# Patient Record
Sex: Male | Born: 1975 | Race: Black or African American | Hispanic: No | Marital: Single | Smoking: Current every day smoker
Health system: Southern US, Community
[De-identification: ages and names within clinical notes are randomized; demographics above are authoritative.]

---

## 2014-09-02 ENCOUNTER — Encounter (HOSPITAL_BASED_OUTPATIENT_CLINIC_OR_DEPARTMENT_OTHER): Payer: Self-pay | Admitting: *Deleted

## 2014-09-02 ENCOUNTER — Emergency Department (HOSPITAL_BASED_OUTPATIENT_CLINIC_OR_DEPARTMENT_OTHER): Payer: Self-pay

## 2014-09-02 ENCOUNTER — Emergency Department (HOSPITAL_BASED_OUTPATIENT_CLINIC_OR_DEPARTMENT_OTHER)
Admission: EM | Admit: 2014-09-02 | Discharge: 2014-09-02 | Disposition: A | Discharge status: Home / Self Care | Payer: Self-pay | Attending: Emergency Medicine | Admitting: Emergency Medicine

## 2014-09-02 DIAGNOSIS — Y998 Other external cause status: Secondary | ICD-10-CM | POA: Insufficient documentation

## 2014-09-02 DIAGNOSIS — Z720 Tobacco use: Secondary | ICD-10-CM | POA: Insufficient documentation

## 2014-09-02 DIAGNOSIS — Y9389 Activity, other specified: Secondary | ICD-10-CM | POA: Insufficient documentation

## 2014-09-02 DIAGNOSIS — Y9289 Other specified places as the place of occurrence of the external cause: Secondary | ICD-10-CM | POA: Insufficient documentation

## 2014-09-02 DIAGNOSIS — S93402A Sprain of unspecified ligament of left ankle, initial encounter: Secondary | ICD-10-CM | POA: Insufficient documentation

## 2014-09-02 DIAGNOSIS — X58XXXA Exposure to other specified factors, initial encounter: Secondary | ICD-10-CM | POA: Insufficient documentation

## 2014-09-02 NOTE — ED Notes (Signed)
Pt injured his left ankle 2 days ago while moving furniture. Same is swollen. Pt able to ambulate unassisted, with quick steady gait.

## 2014-09-02 NOTE — Discharge Instructions (Signed)
1. Medications: Ibuprofen as needed, usual home medications 2. Treatment: rest, drink plenty of fluids, use brace 3. Follow Up: Please followup with your primary doctor in 2-3 days for discussion of your diagnoses and further evaluation after today's visit; if you do not have a primary care doctor use the resource guide provided to find one; Please return to the ER for worsening symptoms    Ankle Sprain An ankle sprain is an injury to the strong, fibrous tissues (ligaments) that hold the bones of your ankle joint together.  CAUSES An ankle sprain is usually caused by a fall or by twisting your ankle. Ankle sprains most commonly occur when you step on the outer edge of your foot, and your ankle turns inward. People who participate in sports are more prone to these types of injuries.  SYMPTOMS   Pain in your ankle. The pain may be present at rest or only when you are trying to stand or walk.  Swelling.  Bruising. Bruising may develop immediately or within 1 to 2 days after your injury.  Difficulty standing or walking, particularly when turning corners or changing directions. DIAGNOSIS  Your caregiver will ask you details about your injury and perform a physical exam of your ankle to determine if you have an ankle sprain. During the physical exam, your caregiver will press on and apply pressure to specific areas of your foot and ankle. Your caregiver will try to move your ankle in certain ways. An X-ray exam may be done to be sure a bone was not broken or a ligament did not separate from one of the bones in your ankle (avulsion fracture).  TREATMENT  Certain types of braces can help stabilize your ankle. Your caregiver can make a recommendation for this. Your caregiver may recommend the use of medicine for pain. If your sprain is severe, your caregiver may refer you to a surgeon who helps to restore function to parts of your skeletal system (orthopedist) or a physical therapist. HOME CARE  INSTRUCTIONS   Apply ice to your injury for 1-2 days or as directed by your caregiver. Applying ice helps to reduce inflammation and pain.  Put ice in a plastic bag.  Place a towel between your skin and the bag.  Leave the ice on for 15-20 minutes at a time, every 2 hours while you are awake.  Only take over-the-counter or prescription medicines for pain, discomfort, or fever as directed by your caregiver.  Elevate your injured ankle above the level of your heart as much as possible for 2-3 days.  If your caregiver recommends crutches, use them as instructed. Gradually put weight on the affected ankle. Continue to use crutches or a cane until you can walk without feeling pain in your ankle.  If you have a plaster splint, wear the splint as directed by your caregiver. Do not rest it on anything harder than a pillow for the first 24 hours. Do not put weight on it. Do not get it wet. You may take it off to take a shower or bath.  You may have been given an elastic bandage to wear around your ankle to provide support. If the elastic bandage is too tight (you have numbness or tingling in your foot or your foot becomes cold and blue), adjust the bandage to make it comfortable.  If you have an air splint, you may blow more air into it or let air out to make it more comfortable. You may take your splint off  at night and before taking a shower or bath. Wiggle your toes in the splint several times per day to decrease swelling. SEEK MEDICAL CARE IF:   You have rapidly increasing bruising or swelling.  Your toes feel extremely cold or you lose feeling in your foot.  Your pain is not relieved with medicine. SEEK IMMEDIATE MEDICAL CARE IF:  Your toes are numb or blue.  You have severe pain that is increasing. MAKE SURE YOU:   Understand these instructions.  Will watch your condition.  Will get help right away if you are not doing well or get worse. Document Released: 12/22/2004 Document  Revised: 09/16/2011 Document Reviewed: 01/03/2011 [facility] Patient Information 2015 [place], [place]. This information is not intended to replace advice given to you by your health care provider. Make sure you discuss any questions you have with your health care provider.    Emergency Department Resource Guide 1) Find a Doctor and Pay Out of Pocket Although you won't have to find out who is covered by your insurance plan, it is a good idea to ask around and get recommendations. You will then need to call the office and see if the doctor you have chosen will accept you as a new patient and what types of options they offer for patients who are self-pay. Some doctors offer discounts or will set up payment plans for their patients who do not have insurance, but you will need to ask so you aren't surprised when you get to your appointment.  2) Contact Your Local Health Department Not all health departments have doctors that can see patients for sick visits, but many do, so it is worth a call to see if yours does. If you don't know where your local health department is, you can check in your phone book. The CDC also has a tool to help you locate your state's health department, and many state websites also have listings of all of their local health departments.  3) Find a Walk-in Clinic If your illness is not likely to be very severe or complicated, you may want to try a walk in clinic. These are popping up all over the country in pharmacies, drugstores, and shopping centers. They're usually staffed by nurse practitioners or physician assistants that have been trained to treat common illnesses and complaints. They're usually fairly quick and inexpensive. However, if you have serious medical issues or chronic medical problems, these are probably not your best option.  No Primary Care Doctor: - Call Health Connect at  [phone] - they can help you locate a primary care doctor that  accepts your insurance,  provides certain services, etc. - Physician Referral Service- [phone]  Chronic Pain Problems: Organization         Address  Phone   Notes  [facility]  [phone] Patients need to be referred by their primary care doctor.   Medication Assistance: Organization         Address  Phone   Notes  [facility] [address] [place] [place], [place] [address] [phone] --Must be a resident of [facility] -- Must have NO insurance coverage whatsoever (no Medicaid/ Medicare, etc.) -- The pt. MUST have a primary care doctor that directs their care regularly and follows them in the community   [facility]ist  [phone]   [employer]  [phone]    Agencies that provide inexpensive medical care: Organization  Address  Phone   Notes  [facility]  [phone]) [phone]   [facility]    [phone]   [facility] [address] [place], [place] [address] [phone]   [facility] [address], [place] [phone]) [phone]   [facility]    [phone]) [phone]   [facility]    [phone]   [facility]  [address], [place] Phone:  [phone](336) [phone], Fax:  [phone] Hours of Operation:  9 am - 6 pm, M-F.  Also accepts Medicaid/Medicare and self-pay.  [facility]  [address], [place] Phone: [phone], Fax: [phone]. Hours of Operation:  8:30 am - 5:30 pm, M-F.  Also accepts Medicaid and self-pay.  [facility] [place] [address], [place] Phone: [phone]) [phone]   [facility] [address], [place], [place] [phone], Ext. [phone] Mondays & Thursdays: 7-9 AM.  First 15 patients are seen on a first come, first serve basis.    Medicaid-accepting [facility] Providers:  Organization         Address  Phone    Notes  [facility] [address],  [phone] Also accepts self-pay patients.  [facility] [address], [place]  [phone]) [phone]   [facility] [address], [place] [phone](336) [phone]   [facility] [address] [place] [address], [place] [phone]) [phone]   Renaye RakersVeita Bland [address], [place]   [phone] Only accepts [place] Access [place] patients after they have their name applied to their card.   Self-Pay (no insurance) in [facility]:  Organization         Address  Phone   Notes  Sickle Cell Patients, [facility] [address], [place] [phone]) [phone]   [facility] [address], [place] [phone]) [phone]   [facility]  [address], [place] [phone]) [phone]   [facility]/Dr. Osei-Bonsu  [address] [place] [address], [place] or [id number], [place] [phone] Phone number for both [place] and [place] locations is the same.  [facility] [address], [place] [phone]) [phone]   [facility]  [address] [place] [address], [place] or [address] [phone]) [phone]) [phone]   [facility] [address], [place] [phone]) [phone], phone; [phone]) [phone], fax Sees patients 1st and 3rd Saturday of every month.  Must not qualify for public or private insurance (i.e. Medicaid, Medicare, [place] Health Choice, Veterans' Benefits)  Household income should be no more than 200% of the poverty level The clinic cannot treat you if you are pregnant or think you are pregnant  Sexually transmitted diseases are not treated at the clinic.    Dental Care: Organization         Address  Phone  Notes  [facility] Department of [facility] [address], [place] [phone])  [phone] Accepts children up to age 41 who are enrolled in [place] or [place] Health Choice; pregnant women with a Medicaid card; and children who have applied for Medicaid or [place] Health Choice, but were declined, whose parents can pay a reduced fee at time  of service.  [facility]  [address], [place] [phone] Accepts children up to age 92 who are enrolled in [place] or [place]; pregnant women with a Medicaid card; and children who have applied for Medicaid or [place] Health Choice, but were declined, whose parents can pay a reduced fee at time of service.  [place] Adult Dental Access PROGRAM  [place] [phone] Patients are seen by appointment only. Walk-ins are not accepted. [place] will see patients 20 years of age and older. Monday - Tuesday (8am-5pm) Most Wednesdays (8:30-5pm) $30 per visit, cash only  [facility] Adult Dental Access PROGRAM  [address], [facility] [phone] Patients are seen by appointment only. Walk-ins are not accepted. [place] will see patients 45 years of age and older. One Wednesday Evening (Monthly: Volunteer Based).  $30 per visit, cash only  [place]  [phone] for adults; Children under age 21, call Graduate Pediatric Dentistry at [phone]. Children aged 34-14, please call [phone] to request a pediatric application.  Dental services are provided in all areas of dental care including fillings, crowns and bridges, complete and partial dentures, implants, gum treatment, root canals, and extractions. Preventive care is also provided. Treatment is provided to both adults and children. Patients are selected via a lottery and there is often a waiting list.   [facility] [address] [place]  [phone] [email or web address]   [facility] [address] [place], [place] [phone], Ext.  [phone] Second and Fourth Thursday of each month, opens at 6:30 AM; Clinic ends at 9 AM.  Patients are seen on a first-come first-served basis, and a limited number are seen during each clinic.   [facility]  [address] Hillard Danker [place], [place] [phone]   Eligibility Requirements You must have lived in [place], [place], or [place] counties for at least the last three months.   You cannot be eligible for state or federal sponsored [employer], including [employer], [place], or [employer].   You generally cannot be eligible for healthcare insurance through your employer.    How to apply: Eligibility screenings are held every Tuesday and Wednesday afternoon from 1:00 pm until 4:00 pm. You do not need an appointment for the interview!  [facility] [address], [place], [place]   [facility]  [place] Department  [place]  [phone]    Behavioral Health Resources in the Community: Intensive Outpatient Programs Organization         Address  Phone  Notes  Shoshoni Jennings. [address] [place], [place] [phone]   [facility] [address] [place], [place]   [facility]: [facility] [address], [place], [place]   [place] [address],  [place], [place] or [phone]   Substance Abuse Resources Organization         Address  Phone  Notes  Al[facility] Drug Services  [phone]   [place]  [phone]   [facility] [place]  6305548338   Chinita Pester  [phone]   Residential & Outpatient Substance Abuse Program  [phone]   Psychological Services Organization         Address  Phone  Notes  [facility]  [phone]   [facility]  [phone]   [facility]  Mental Health [address] [place] [phone] or [phone]    Mobile Crisis Teams Organization         Address  Phone  Notes  [facility], Mobile Crisis Care Unit  [phone]   [facility]  [address] [place], [place] [phone]   Doristine Locks [address] [place] [phone]    Self-Help/Support Groups Organization         Address  Phone             Notes  [facility] [place] - variety of support groups  336- I7437963 Call for more information  Narcotics Anonymous (NA), [facility] [address], [employer] [place]  2 meetings at this location   Statistician         Address  Phone  Notes  [facility] [address],    [place]  [phone]   [facility]  [address], [place] [address], [place], [place] [phone]   [facility] [address] [place], [place] [phone] Admissions: 8am-3pm M-F  [facility] [address]    [place], [place] [phone]   [facility] [address] [place], [place], [place] [phone]   [facility] [address],  [place], [place] [phone]   [facility] - Intensive Outpatient [address], [place], [place] [phone]   [facility] ([facility] [address]  [place], [place] [phone] or [phone]   [facility] (RTS) [address], [place], [place] [phone] Accepts Medicaid  [facility] [place] [address],  [place] [phone] Substance Abuse/Addiction Treatment   [facility] Organization         Address  Phone  Notes  [facility]  [phone]   Angie Fava, PhD [address] [place]io [place], [place]   [phone] or [phone]   [facility]   [address] [place], [place] [phone]   [facility] [address] [place], [place] [phone] Insurance/Medicaid/sponsorship through [facility] and Families [address]                                    [place], [place] [phone] Therapy/tele-psych/case  [facility] [address], [place] [phone]    Dr. Lolly Mustache  [phone]   [facility]  [employer] [facility] Dept. 1) [address] [place] 2) [address] [place] 3)  [address], [place] [phone]  [phone]   [facility] Child Abuse Hotline 564-098-9803 or [phone] (After Hours)

## 2014-09-02 NOTE — ED Provider Notes (Signed)
CSN: [id number]     Arrival date & time 09/02/14  1341 History   First MD Initiated Contact with Patient 09/02/14 1436     Chief Complaint  Patient presents with  . Ankle Pain     (Consider location/radiation/quality/duration/timing/severity/associated sxs/prior Treatment) The history is provided by the patient and medical records. No language interpreter was used.    Joseph Brock is a 39 y.o. male  with no major medical history presents to the Emergency Department complaining of gradual, persistent, progressively worsening swelling to the left ankle while moving furniture 2 days ago. Patient reports that he has minimal pain in the ankle and is able to walk without difficulty. No numbness, tingling, weakness, discoloration, open wounds. Patient denies history of diabetes. Patient Excedrin with moderate relief. No other treatments prior to arrival.   History reviewed. No pertinent past medical history. History reviewed. No pertinent past surgical history. No family history on file. Social History  Substance Use Topics  . Smoking status: Current Every Day Smoker  . Smokeless tobacco: None  . Alcohol Use: No    Review of Systems  Constitutional: Negative for fever and chills.  Gastrointestinal: Negative for nausea and vomiting.  Musculoskeletal: Positive for joint swelling and arthralgias. Negative for back pain, neck pain and neck stiffness.  Skin: Negative for wound.  Neurological: Negative for numbness.  Hematological: Does not bruise/bleed easily.  Psychiatric/Behavioral: The patient is not nervous/anxious.   All other systems reviewed and are negative.     Allergies  Review of patient's allergies indicates no known allergies.  Home Medications   Prior to Admission medications   Not on File   BP 121/80 mmHg  Pulse 58  Temp(Src) 98.6 F (37 C) (Oral)  Resp 16  Ht 6\' 2"  (1.88 m)  Wt 195 lb (88.451 kg)  BMI 25.03 kg/m2  SpO2 100% Physical Exam  Constitutional:  He appears well-developed and well-nourished. No distress.  HENT:  Head: Normocephalic and atraumatic.  Eyes: Conjunctivae are normal.  Neck: Normal range of motion.  Cardiovascular: Normal rate, regular rhythm, normal heart sounds and intact distal pulses.   No murmur heard. Capillary refill < 3 sec  Pulmonary/Chest: Effort normal and breath sounds normal.  Musculoskeletal: He exhibits tenderness. He exhibits no edema.  ROM: FROM of the left ankle  Neurological: He is alert. Coordination normal.  Sensation intact to dull and sharp Strength 5/5 with dorsiflexion and plantar flexion  Skin: Skin is warm and dry. He is not diaphoretic.  No tenting of the skin  Psychiatric: He has a normal mood and affect.  Nursing note and vitals reviewed.   ED Course  Procedures (including critical care time) Labs Review Labs Reviewed - No data to display  Imaging Review Dg Ankle Complete Left  09/02/2014   CLINICAL DATA:  Left ankle injury, moving furniture, swelling and bruising.  EXAM: LEFT ANKLE COMPLETE - 3+ VIEW  COMPARISON:  None.  FINDINGS: Diffuse ankle soft tissue swelling. Normal alignment without acute fracture. Left distal tibia, fibula, talus and calcaneus appear intact. Preserved joint spaces.  IMPRESSION: Soft tissue swelling without acute osseous finding   Electronically Signed   By: Judie Petit.  Shick M.D.   On: 09/02/2014 14:09   I have personally reviewed and evaluated these images and lab results as part of my medical decision-making.   EKG Interpretation None      MDM   Final diagnoses:  Left ankle sprain, initial encounter   Joseph Brock presents with left ankle pain.  Patient X-Ray negative for obvious fracture or dislocation. Pain managed in ED. Pt advised to follow up with orthopedics if symptoms persist for possibility of missed fracture diagnosis. Patient given brace while in ED, conservative therapy recommended and discussed. Patient will be dc home & is agreeable with  above plan.  BP 121/80 mmHg  Pulse 58  Temp(Src) 98.6 F (37 C) (Oral)  Resp 16  Ht  (1.88 m)  Wt 195 lb (88.451 kg)  BMI 25.03 kg/m2  SpO2 100%   Dierdre Forth, PA-C 09/02/14 1533  Rolland Porter, MD 09/06/14 [phone]

## 2017-07-18 IMAGING — DX DG ANKLE COMPLETE 3+V*L*
3 series · 3 of 3 positions shown · non-contrast
Comparison: None.

CLINICAL DATA: Left ankle injury, moving furniture, swelling and
bruising.

EXAM:
LEFT ANKLE COMPLETE - 3+ VIEW

[ankle ap]
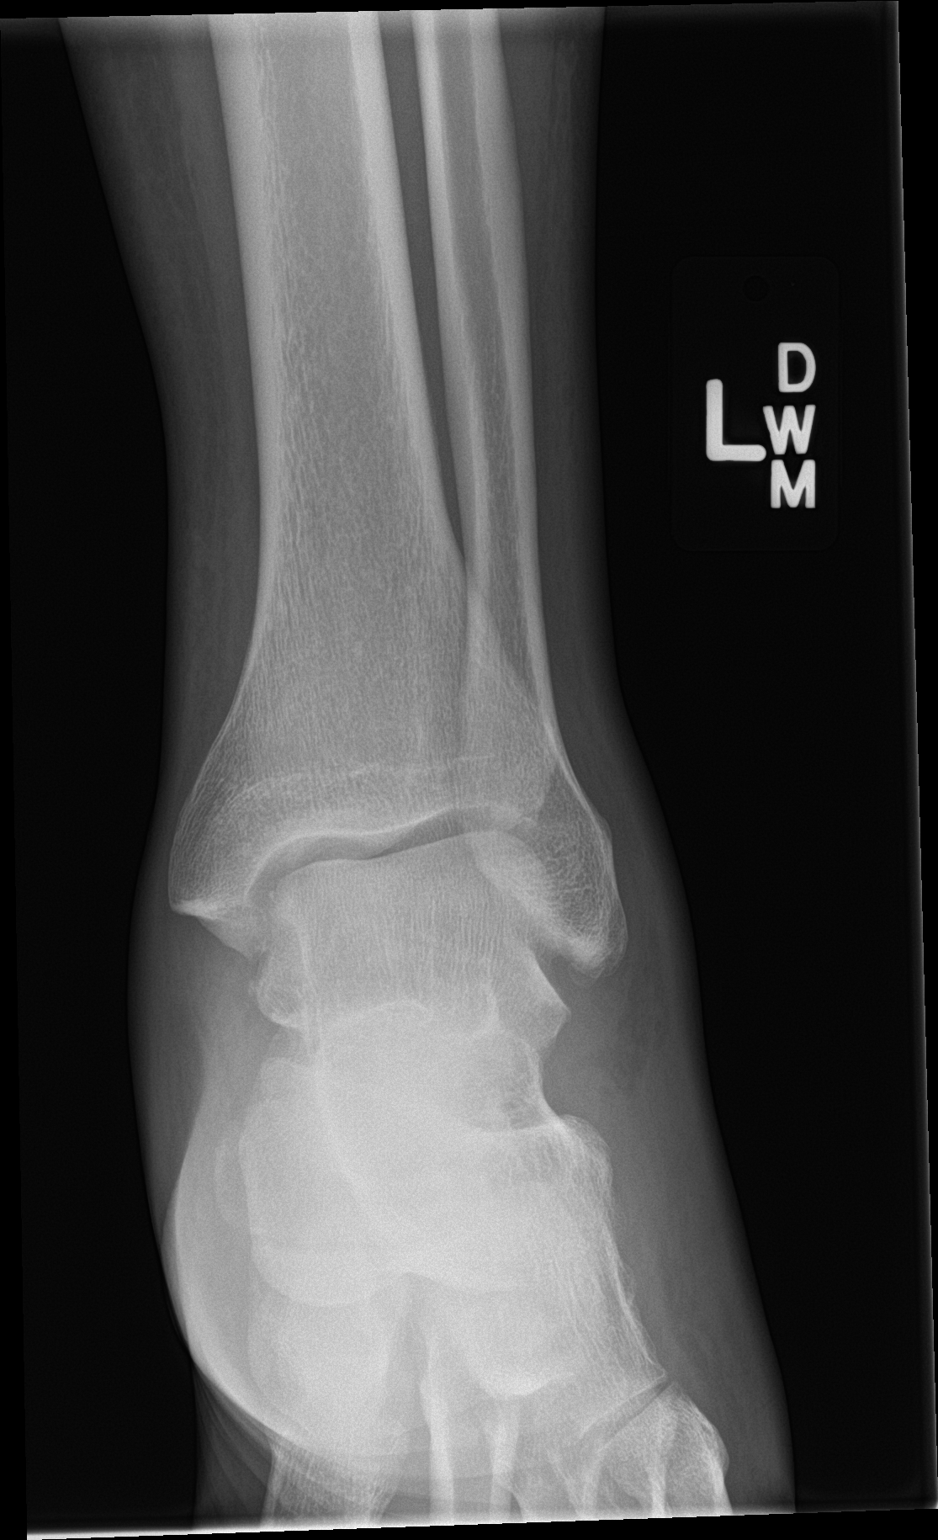

[ankle obl]
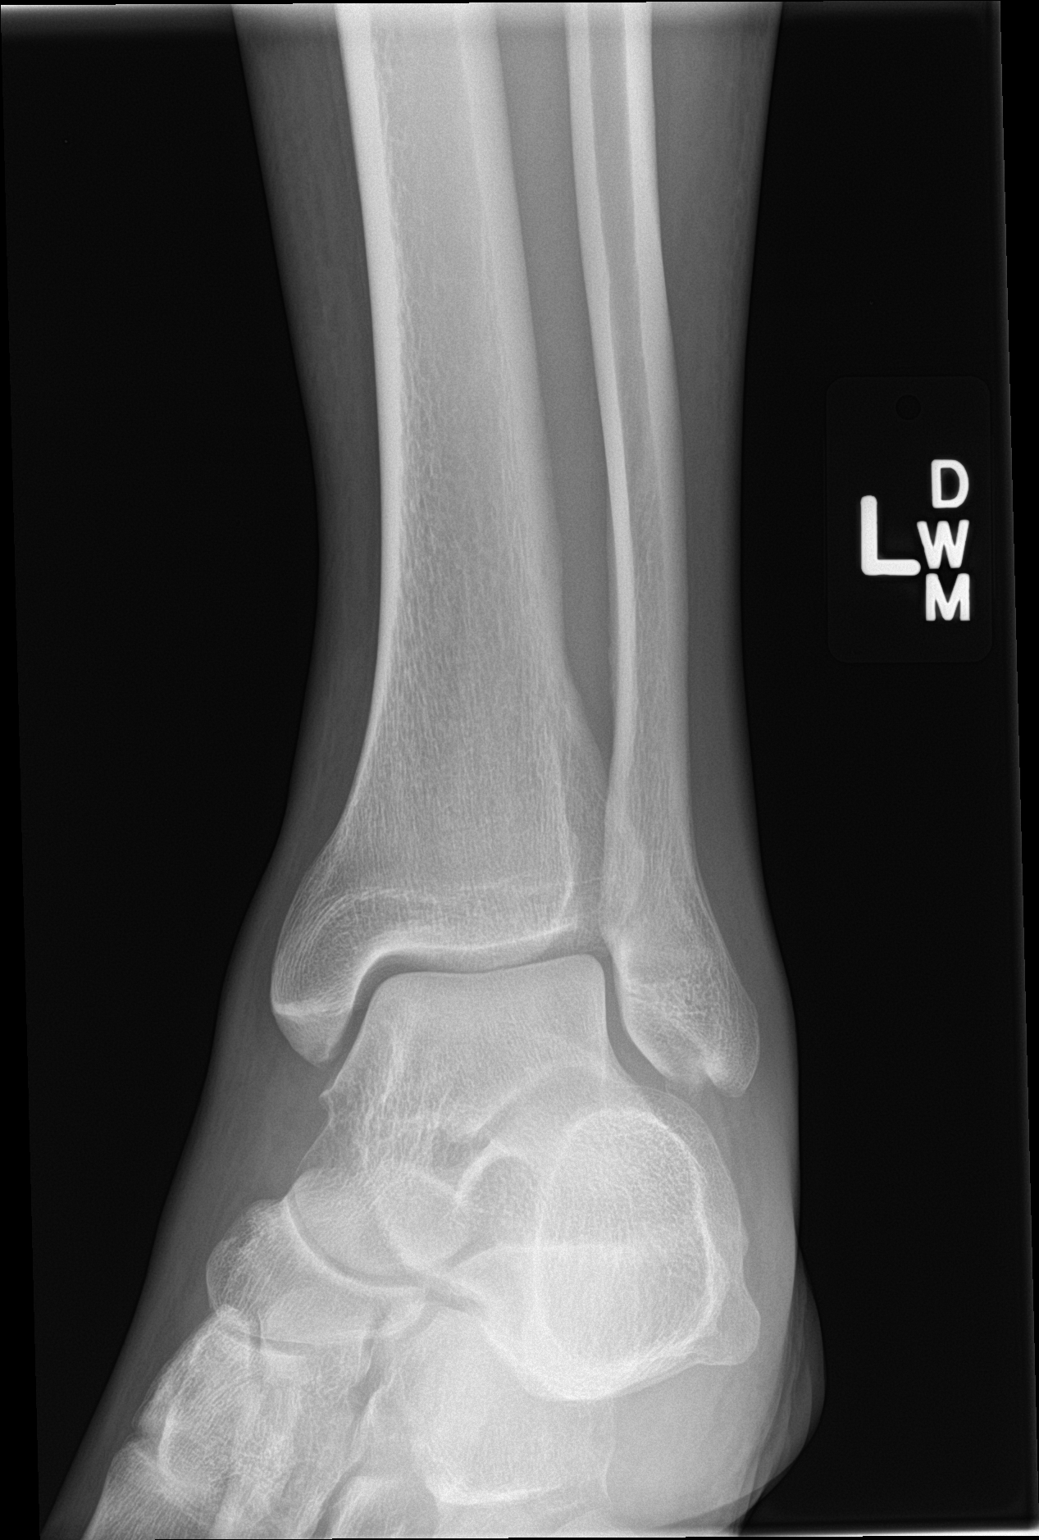

[ankle lat]
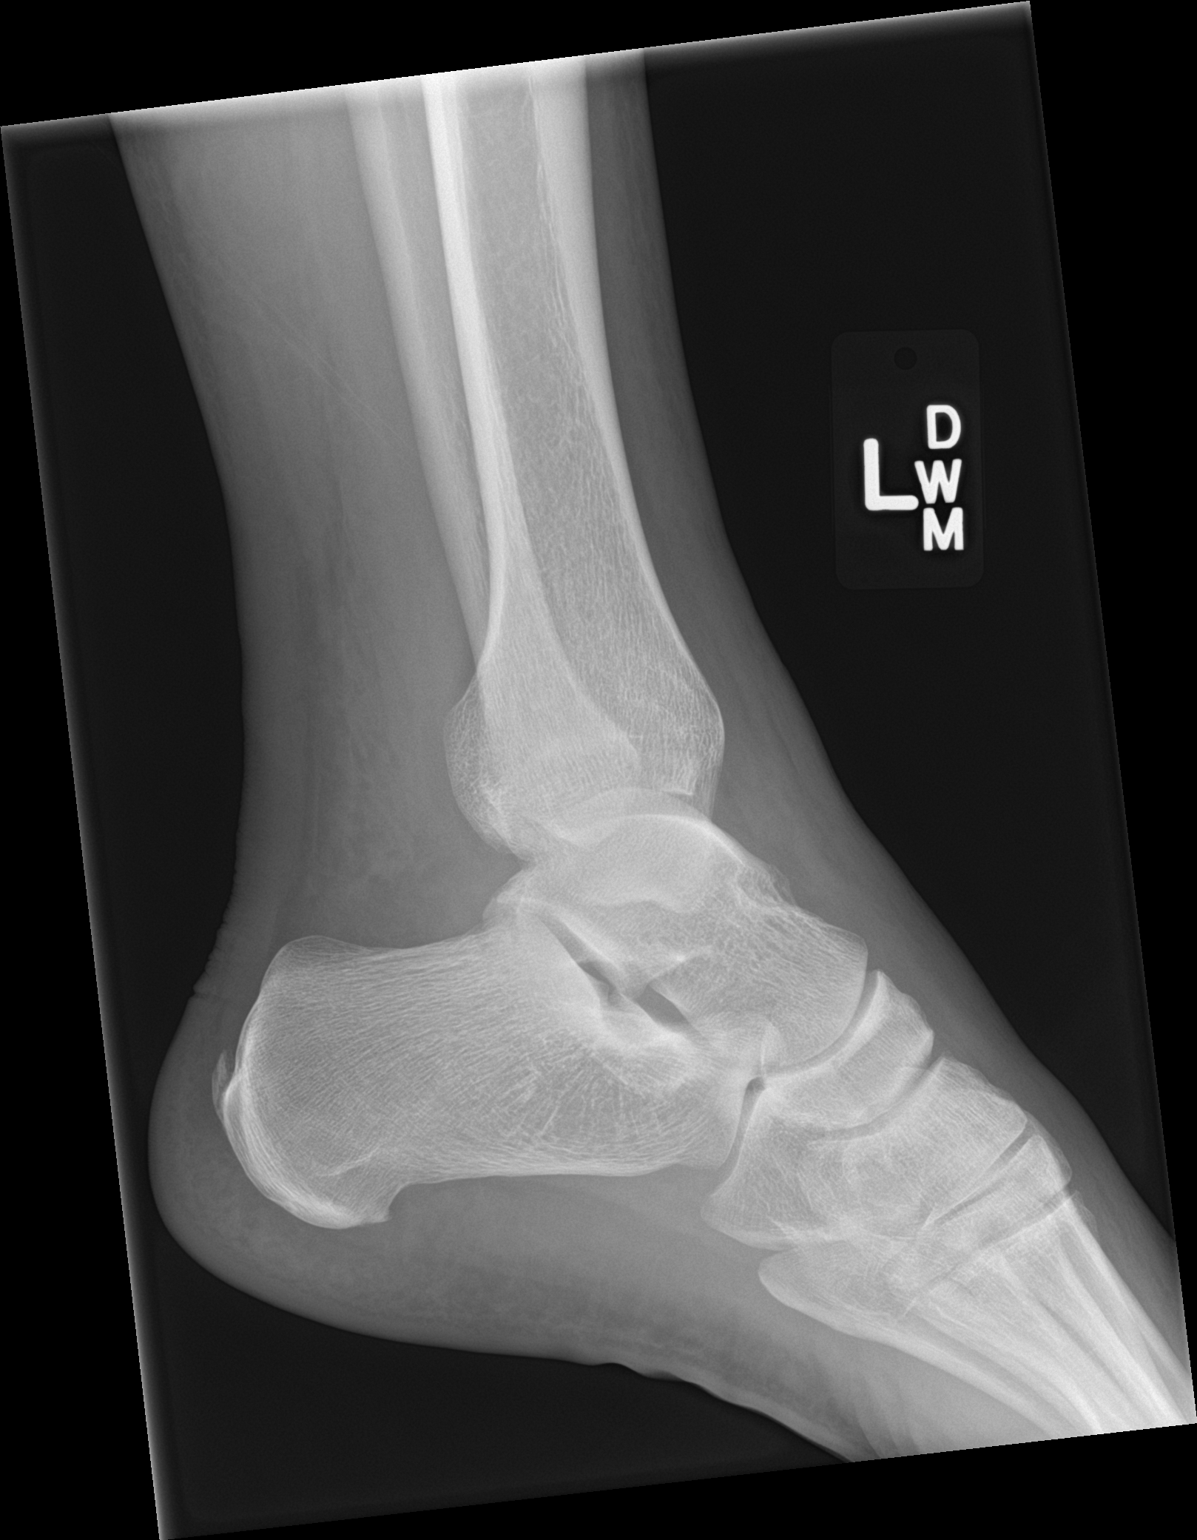

[3 of 3 positions shown; findings below may reference images not displayed]

FINDINGS: Diffuse ankle soft tissue swelling. Normal alignment without acute
fracture. Left distal tibia, fibula, talus and calcaneus appear
intact. Preserved joint spaces.
IMPRESSION: Soft tissue swelling without acute osseous finding

## 2024-02-03 ENCOUNTER — Encounter (HOSPITAL_BASED_OUTPATIENT_CLINIC_OR_DEPARTMENT_OTHER): Payer: Self-pay

## 2024-02-03 ENCOUNTER — Emergency Department (HOSPITAL_BASED_OUTPATIENT_CLINIC_OR_DEPARTMENT_OTHER)
Admission: EM | Admit: 2024-02-03 | Discharge: 2024-02-03 | Disposition: A | Discharge status: Home / Self Care | Attending: Emergency Medicine | Admitting: Emergency Medicine

## 2024-02-03 ENCOUNTER — Other Ambulatory Visit: Payer: Self-pay

## 2024-02-03 ENCOUNTER — Emergency Department (HOSPITAL_BASED_OUTPATIENT_CLINIC_OR_DEPARTMENT_OTHER)

## 2024-02-03 DIAGNOSIS — S63502A Unspecified sprain of left wrist, initial encounter: Secondary | ICD-10-CM | POA: Insufficient documentation

## 2024-02-03 DIAGNOSIS — W000XXA Fall on same level due to ice and snow, initial encounter: Secondary | ICD-10-CM | POA: Insufficient documentation

## 2024-02-03 DIAGNOSIS — M25532 Pain in left wrist: Secondary | ICD-10-CM | POA: Diagnosis present

## 2024-02-03 NOTE — ED Triage Notes (Signed)
 Pt slipped in the snow and fell on his left wrist last night about 1800. Now decreased ROM and swelling noted.

## 2024-02-03 NOTE — ED Provider Notes (Signed)
 " [facility] Provider Note   CSN: [id number] Arrival date & time: 02/03/24  9252     Patient presents with: Joseph Brock is a 49 y.o. male.   Patient here pain to his left wrist after fall last night.  Decreased range of motion swelling.  No prior injury.  Did not hit his head or lose consciousness.  Not on blood thinners.  No pain elsewhere.  No neck pain.  Nothing makes it better or worse.  The history is provided by the patient.       Prior to Admission medications  Not on File    Allergies: Patient has no known allergies.    Review of Systems  Updated Vital Signs BP (!) 134/90 (BP Location: Right Arm)   Pulse 60   Temp 98.2 F (36.8 C) (Oral)   Resp 16   Ht 6' 2 (1.88 m)   Wt 78.5 kg   SpO2 100%   BMI 22.21 kg/m   Physical Exam Vitals and nursing note reviewed.  Constitutional:      General: He is not in acute distress.    Appearance: He is well-developed.  HENT:     Head: Normocephalic and atraumatic.  Eyes:     Conjunctiva/sclera: Conjunctivae normal.  Cardiovascular:     Rate and Rhythm: Normal rate and regular rhythm.     Pulses: Normal pulses.     Heart sounds: No murmur heard. Pulmonary:     Effort: Pulmonary effort is normal. No respiratory distress.     Breath sounds: Normal breath sounds.  Abdominal:     General: Abdomen is flat.     Palpations: Abdomen is soft.     Tenderness: There is no abdominal tenderness.  Musculoskeletal:        General: Swelling and tenderness present.     Cervical back: Neck supple.     Comments: Tenderness and swelling to the left wrist  Skin:    General: Skin is warm and dry.     Capillary Refill: Capillary refill takes less than 2 seconds.  Neurological:     General: No focal deficit present.     Mental Status: He is alert.     Sensory: No sensory deficit.     Motor: No weakness.  Psychiatric:        Mood and Affect: Mood normal.     (all labs  ordered are listed, but only abnormal results are displayed) Labs Reviewed - No data to display  EKG: None  Radiology: DG Wrist Complete Left Result Date: 02/03/2024 EXAM: 3 OR MORE VIEW(S) XRAY OF THE LEFT WRIST 02/03/2024 08:13:00 AM COMPARISON: None available. CLINICAL HISTORY: Slipped on ice landing on left wrist. FINDINGS: BONES AND JOINTS: No acute fracture. No malalignment. 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 SOFT TISSUES: Unremarkable. IMPRESSION: 1. No acute fracture or dislocation. Electronically signed by: Waddell Calk MD 02/03/2024 08:29 AM EST RP Workstation: [id number]     Procedures   Medications Ordered in the ED - No data to display                                  Medical Decision Making Amount and/or Complexity of Data Reviewed Radiology: ordered.   Joseph Brock is here with left wrist pain after fall.  Differential diagnosis sprain versus contusion versus fracture.  Neurovascular neuromuscular intact on exam.  Will get an x-ray of the left wrist.  X-ray showed no fracture or dislocation per radiology report.  I do suspect a wrist sprain placed in a removable splint.  Recommend Tylenol ice ibuprofen follow-up with hand/primary care.  Discharge.  This chart was dictated using voice recognition software.  Despite best efforts to proofread,  errors can occur which can change the documentation meaning.      Final diagnoses:  Wrist sprain, left, initial encounter    ED Discharge Orders     None          Ruthe Cornet, DO 02/03/24 [id number]  "

## 2024-02-03 NOTE — Discharge Instructions (Signed)
 Overall suspect you have a sprain of your left wrist.  There is no fracture or broken bone.  Follow-up with hand team or primary care doctor.  Tylenol ibuprofen ice and rest.  Use splint for comfort.
